# Patient Record
Sex: Male | Born: 1970 | Race: White | Hispanic: No | Marital: Married | Smoking: Current some day smoker
Health system: Southern US, Community
[De-identification: ages and names within clinical notes are randomized; demographics above are authoritative.]

## PROBLEM LIST (undated history)

## (undated) DIAGNOSIS — F17200 Nicotine dependence, unspecified, uncomplicated: Secondary | ICD-10-CM

## (undated) DIAGNOSIS — M5136 Other intervertebral disc degeneration, lumbar region: Secondary | ICD-10-CM

## (undated) DIAGNOSIS — E559 Vitamin D deficiency, unspecified: Secondary | ICD-10-CM

## (undated) DIAGNOSIS — K760 Fatty (change of) liver, not elsewhere classified: Secondary | ICD-10-CM

## (undated) DIAGNOSIS — R030 Elevated blood-pressure reading, without diagnosis of hypertension: Secondary | ICD-10-CM

## (undated) DIAGNOSIS — K219 Gastro-esophageal reflux disease without esophagitis: Secondary | ICD-10-CM

## (undated) DIAGNOSIS — E781 Pure hyperglyceridemia: Secondary | ICD-10-CM

## (undated) HISTORY — DX: Nicotine dependence, unspecified, uncomplicated: F17.200

## (undated) HISTORY — DX: Other intervertebral disc degeneration, lumbar region: M51.36

## (undated) HISTORY — DX: Elevated blood-pressure reading, without diagnosis of hypertension: R03.0

## (undated) HISTORY — DX: Vitamin D deficiency, unspecified: E55.9

## (undated) HISTORY — DX: Pure hyperglyceridemia: E78.1

## (undated) HISTORY — DX: Gastro-esophageal reflux disease without esophagitis: K21.9

## (undated) HISTORY — DX: Fatty (change of) liver, not elsewhere classified: K76.0

---

## 2004-09-21 ENCOUNTER — Emergency Department (HOSPITAL_COMMUNITY): Admission: EM | Admit: 2004-09-21 | Discharge: 2004-09-21 | Payer: Self-pay | Admitting: Emergency Medicine

## 2013-07-19 DIAGNOSIS — K760 Fatty (change of) liver, not elsewhere classified: Secondary | ICD-10-CM

## 2013-07-19 HISTORY — DX: Fatty (change of) liver, not elsewhere classified: K76.0

## 2013-08-02 ENCOUNTER — Emergency Department (HOSPITAL_COMMUNITY)
Admission: EM | Admit: 2013-08-02 | Discharge: 2013-08-02 | Disposition: A | Discharge status: Home / Self Care | Payer: BC Managed Care – PPO | Attending: Emergency Medicine | Admitting: Emergency Medicine

## 2013-08-02 ENCOUNTER — Encounter (HOSPITAL_COMMUNITY): Payer: Self-pay | Admitting: Emergency Medicine

## 2013-08-02 ENCOUNTER — Emergency Department (HOSPITAL_COMMUNITY): Payer: BC Managed Care – PPO

## 2013-08-02 DIAGNOSIS — F17200 Nicotine dependence, unspecified, uncomplicated: Secondary | ICD-10-CM | POA: Insufficient documentation

## 2013-08-02 DIAGNOSIS — R109 Unspecified abdominal pain: Secondary | ICD-10-CM | POA: Insufficient documentation

## 2013-08-02 DIAGNOSIS — Z79899 Other long term (current) drug therapy: Secondary | ICD-10-CM | POA: Insufficient documentation

## 2013-08-02 NOTE — Discharge Instructions (Signed)
Your vital signs are within normal limits. Your CT scan is negative for stone, or acute changes. Please discuss your back pain and flank pain with your primary physician. Consideration of evaluation by an orthopedic specialist or spine specialist may be helpful. Continue your current medications. Please return to the emergency department if any emergent changes, problems, or concerns. Flank Pain Flank pain refers to pain that is located on the side of the body between the upper abdomen and the back. The pain may occur over a short period of time (acute) or may be long-term or reoccurring (chronic). It may be mild or severe. Flank pain can be caused by many things. CAUSES  Some of the more common causes of flank pain include:  Muscle strains.   Muscle spasms.   A disease of your spine (vertebral disk disease).   A lung infection (pneumonia).   Fluid around your lungs (pulmonary edema).   A kidney infection.   Kidney stones.   A very painful skin rash caused by the chickenpox virus (shingles).   Gallbladder disease.  HOME CARE INSTRUCTIONS  Home care will depend on the cause of your pain. In general,  Rest as directed by your caregiver.  Drink enough fluids to keep your urine clear or pale yellow.  Only take over-the-counter or prescription medicines as directed by your caregiver. Some medicines may help relieve the pain.  Tell your caregiver about any changes in your pain.  Follow up with your caregiver as directed. SEEK IMMEDIATE MEDICAL CARE IF:   Your pain is not controlled with medicine.   You have new or worsening symptoms.  Your pain increases.   You have abdominal pain.   You have shortness of breath.   You have persistent nausea or vomiting.   You have swelling in your abdomen.   You feel faint or pass out.   You have blood in your urine.  You have a fever or persistent symptoms for more than 2-3 days.  You have a fever and your symptoms  suddenly get worse. MAKE SURE YOU:   Understand these instructions.  Will watch your condition.  Will get help right away if you are not doing well or get worse. Document Released: 02/26/2005 Document Revised: 09/30/2011 Document Reviewed: 08/20/2011 [facility] Patient Information 2015 [employer] [place]. This information is not intended to replace advice given to you by your health care provider. Make sure you discuss any questions you have with your health care provider.

## 2013-08-02 NOTE — ED Notes (Signed)
Intermittent lower back pain since Friday progressively worsening since then. Current aching pain with intermittent sharp pain which extends into groin.  Seen at MD office and treated with steroid therapy, states he had blood in his urine though was negative for stone at PCP. Initial problem began early June 2015.

## 2013-08-02 NOTE — ED Provider Notes (Signed)
CSN: [id number]     Arrival date & time 08/02/13  1455 History   First MD Initiated Contact with Patient 08/02/13 1551     Chief Complaint  Patient presents with  . Back Pain     (Consider location/radiation/quality/duration/timing/severity/associated sxs/prior Treatment) HPI Comments: Patient is a 43 year old male who presents to the emergency department with complaint of back and flank area pain. Pt states this problem started with back pain after he was bending to pick up a [place] flag. He had to use one of his clubs to limp to his cart.  Patient gives additional  history that on Friday, July 10 he was sitting when he had a sharp stabbing pain mostly in the lower back extending into the left flank/hip  area. The patient tried conservative measures at home but this did not get any better. [facility] and [facility] with [facility].  On July 12 the patient [facility], where he was evaluated and noted to have stable vital signs, a negative urine. He was treated with pain medication, but told to come to the emergency department for CT scan to definitively rule out kidney stone or other problem if his pain continued. Earlier the pain would improve mildly with lying on the floor and using ibuprofen, but was not resolved. The patient states that his pain has not improved  With the recent pain meds, and may have been getting worse and he presents now for a CT scan for additional evaluation. There's been no reported fever or chills. No previous operations or procedures involving the lower back.  Patient is a 43 y.o. male presenting with back pain. The history is provided by the patient.  Back Pain Location:  Lumbar spine Associated symptoms: no abdominal pain, no chest pain and no dysuria     History reviewed. No pertinent past medical history. History reviewed. No pertinent past surgical history. History reviewed. No pertinent family history. History   Substance Use Topics  . Smoking status: Current Some Day Smoker    Types: Cigarettes  . Smokeless tobacco: Not on file  . Alcohol Use: Not on file    Review of Systems  Constitutional: Negative for activity change.       All ROS Neg except as noted in HPI  HENT: Negative for nosebleeds.   Eyes: Negative for photophobia and discharge.  Respiratory: Negative for cough, shortness of breath and wheezing.   Cardiovascular: Negative for chest pain and palpitations.  Gastrointestinal: Negative for abdominal pain and blood in stool.  Genitourinary: Positive for flank pain. Negative for dysuria, frequency and hematuria.  Musculoskeletal: Positive for back pain. Negative for arthralgias and neck pain.  Skin: Negative.   Neurological: Negative for dizziness, seizures and speech difficulty.  Psychiatric/Behavioral: Negative for hallucinations and confusion.      Allergies  Review of patient's allergies indicates no known allergies.  Home Medications   Prior to Admission medications   Medication Sig Start Date End Date Taking? Authorizing Provider  ibuprofen (ADVIL,MOTRIN) 800 MG tablet Take 800 mg by mouth 3 (three) times daily as needed. pain 06/29/13  Yes Historical Provider, MD  niacin (NIASPAN) 1000 MG CR tablet Take 1,000 mg by mouth daily. 07/24/13  Yes Historical Provider, MD  oxyCODONE-acetaminophen (PERCOCET/ROXICET) 5-325 MG per tablet Take 1 tablet by mouth every 6 (six) hours as needed. pain 07/30/13  Yes Historical Provider, MD   BP 140/97  Pulse 86  Temp(Src) 98.1 F (36.7  C) (Oral)  Resp 18  Ht 5\' 11"  (1.803 m)  Wt 230 lb (104.327 kg)  BMI 32.09 kg/m2  SpO2 96% Physical Exam  Nursing note and vitals reviewed. Constitutional: He is oriented to person, place, and time. He appears well-developed and well-nourished.  Non-toxic appearance.  HENT:  Head: Normocephalic.  Right Ear: Tympanic membrane and external ear normal.  Left Ear: Tympanic membrane and external ear  normal.  Eyes: EOM and lids are normal. Pupils are equal, round, and reactive to light.  Neck: Normal range of motion. Neck supple. Carotid bruit is not present.  Cardiovascular: Normal rate, regular rhythm, normal heart sounds, intact distal pulses and normal pulses.   Pulmonary/Chest: Breath sounds normal. No respiratory distress.  Abdominal: Soft. Bowel sounds are normal. There is no tenderness. There is no guarding.  Musculoskeletal: Normal range of motion.  Is soreness of the left groin and discomfort of the left hip with range of motion. No hot areas appreciated. There is no palpable step off of the lumbar spine. There is no paraspinal tenderness of the lumbar spine area.  Lymphadenopathy:       Head (right side): No submandibular adenopathy present.       Head (left side): No submandibular adenopathy present.    He has no cervical adenopathy.  Neurological: He is alert and oriented to person, place, and time. He has normal strength. No cranial nerve deficit or sensory deficit. He exhibits normal muscle tone. Coordination normal.  Gait is strong and steady, no foot drop appreciated.  Skin: Skin is warm and dry.  Psychiatric: He has a normal mood and affect. His speech is normal.    ED Course  Procedures (including critical care time) Labs Review Labs Reviewed - No data to display  Imaging Review No results found.   EKG Interpretation None      MDM Vital signs are well within normal limits. Pulse oximetry is 96% on room air.  The CT have been discussed with the patient in terms which he understands. CT scan is read as negative. No emergent findings on exam at this time.  Differential diagnosis at this time would include degenerative joint disease involving the hip or pelvis. Degenerative disc disease of the lumbar spine area. Complex muscle strain.  Patient advised to see his physicians at the [facility] urgent care and consider conversation concerning orthopedic or spine  specialty evaluation.    Final diagnoses:  None    *I have reviewed nursing notes, vital signs, and all appropriate lab and imaging results for this patient.Kathie Dike, PA-C 08/02/13 [phone]

## 2013-08-03 NOTE — ED Provider Notes (Signed)
Medical screening examination/treatment/procedure(s) were performed by non-physician practitioner and as supervising physician I was immediately available for consultation/collaboration.   EKG Interpretation None       Donnetta HutchingBrian Teryn Boerema, MD 08/03/13 Windell [id number]

## 2014-07-20 DIAGNOSIS — M5136 Other intervertebral disc degeneration, lumbar region: Secondary | ICD-10-CM

## 2014-07-20 HISTORY — DX: Other intervertebral disc degeneration, lumbar region: M51.36

## 2015-03-15 IMAGING — CT CT ABD-PELV W/O CM
2 of 4 series · 16 of 46 positions shown, 18 images · non-contrast
Comparison: None.

CLINICAL DATA: Several month history of left flank pain

EXAM:
CT ABDOMEN AND PELVIS WITHOUT CONTRAST
TECHNIQUE: Multidetector CT imaging of the abdomen and pelvis was performed
following the standard protocol without IV contrast.

[Series 2: standard/full over (age)lbs 5.0 · axial · 0.84mm/px · z∈[+226,+682]mm · 13 of 101 slices shown, 15 images]
[im 5/101  soft-tissue]
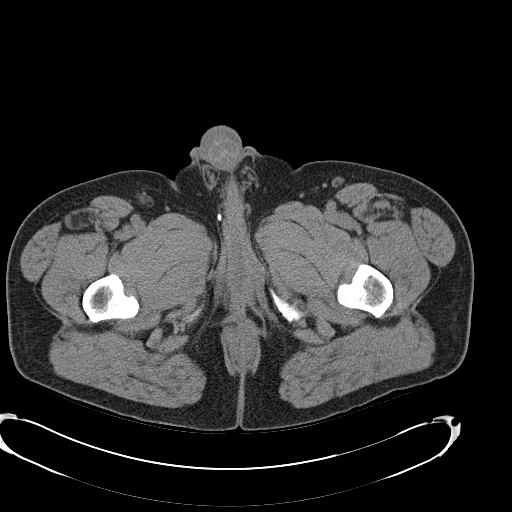
[im 5/101  bone]
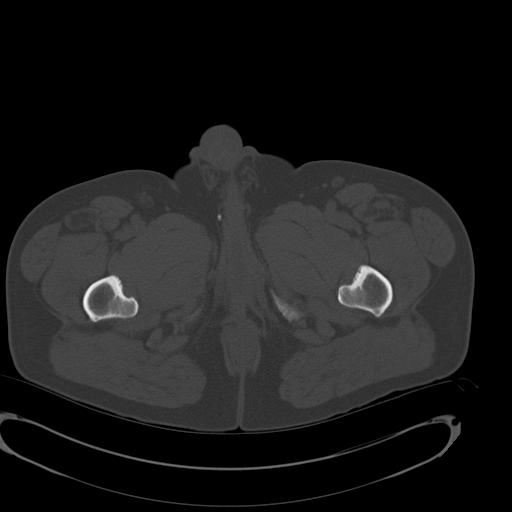
[im 13/101  soft-tissue]
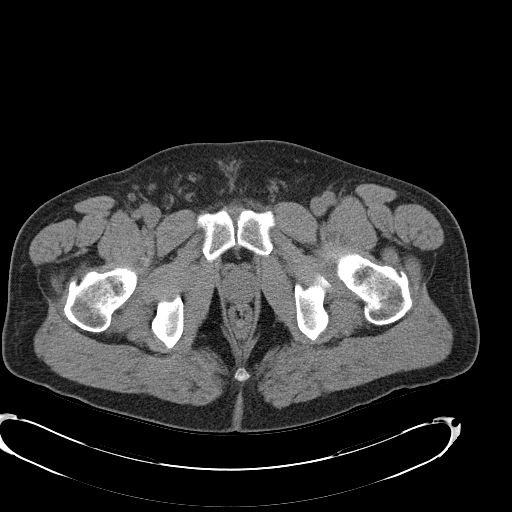
[im 21/101  soft-tissue]
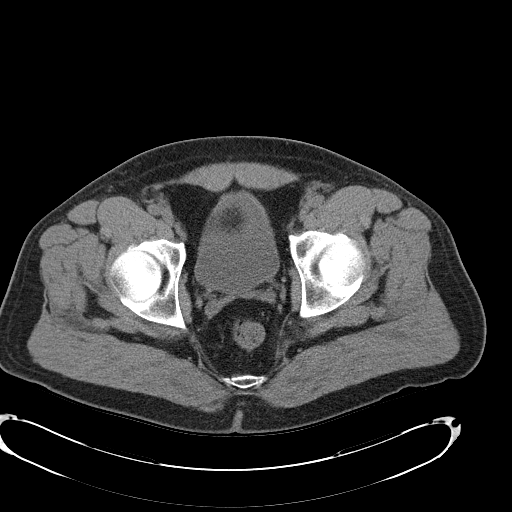
[im 30/101  soft-tissue]
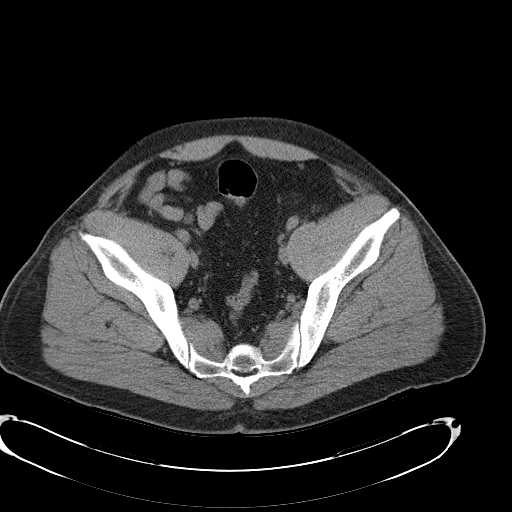
[im 34/101  soft-tissue]
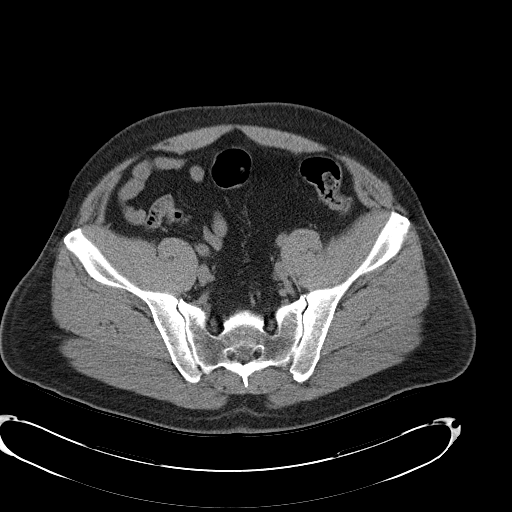
[im 42/101  soft-tissue]
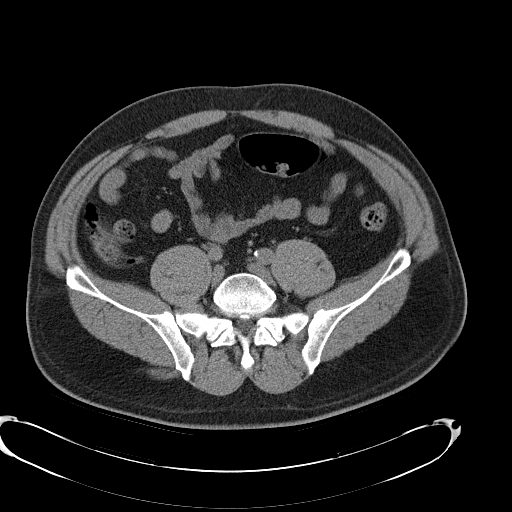
[im 51/101  soft-tissue]
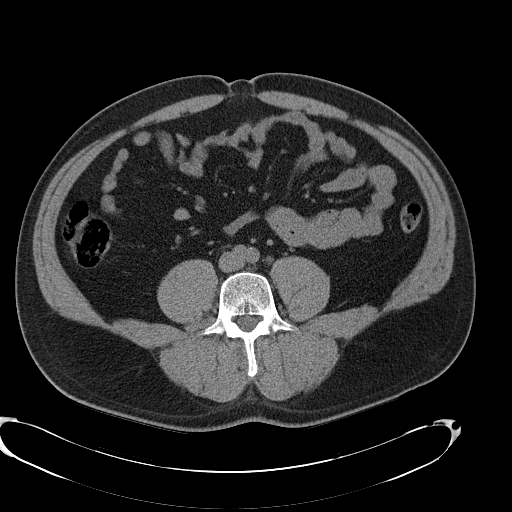
[im 59/101  soft-tissue]
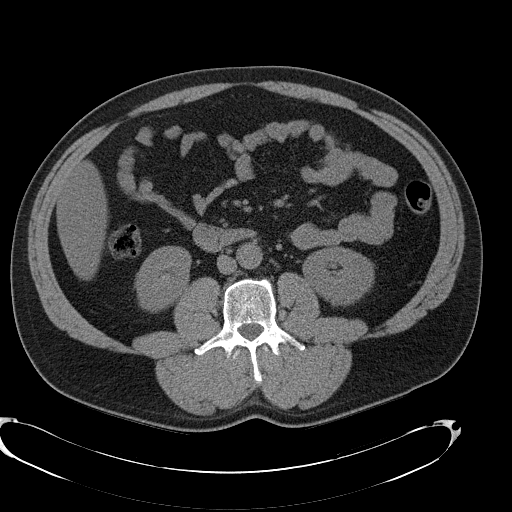
[im 67/101  soft-tissue]
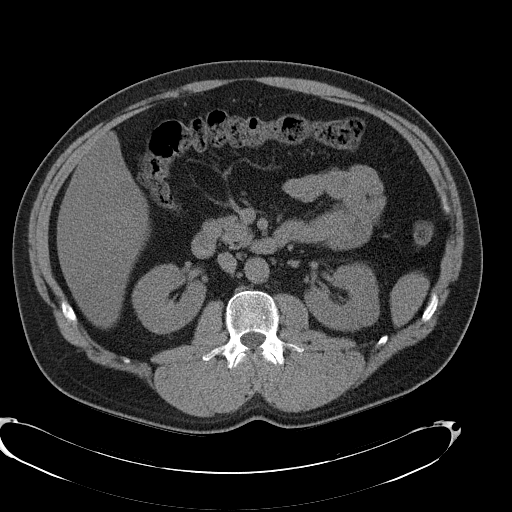
[im 67/101  bone]
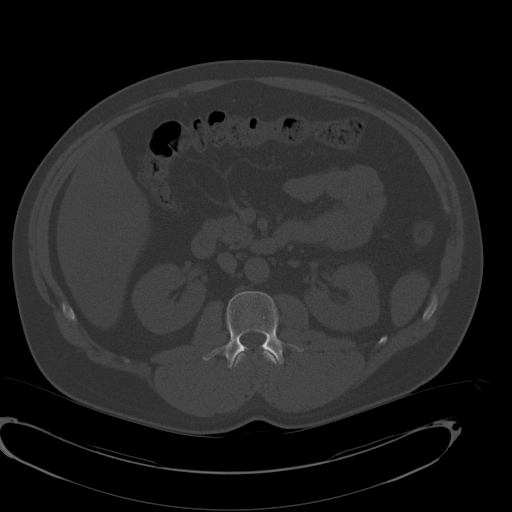
[im 71/101  soft-tissue]
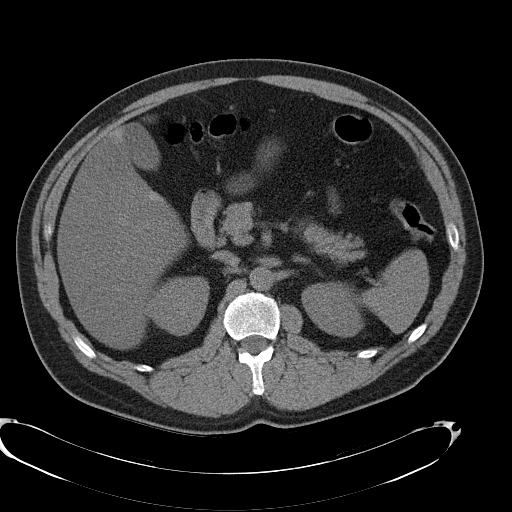
[im 80/101  soft-tissue]
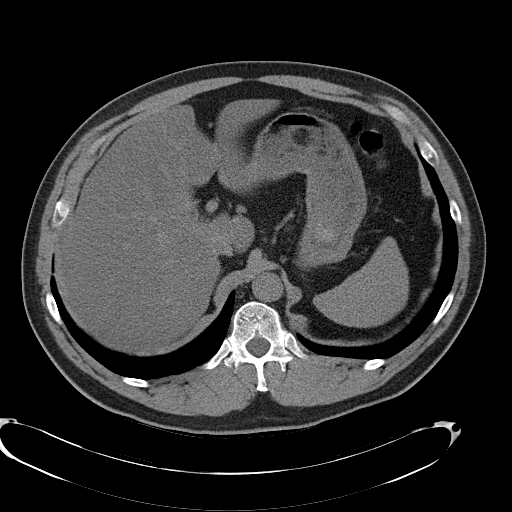
[im 88/101  soft-tissue]
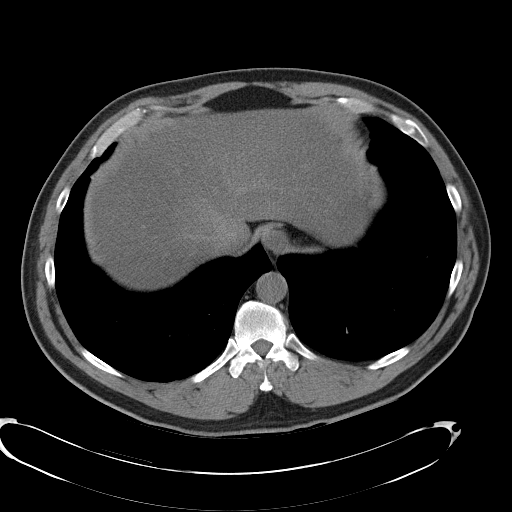
[im 96/101  soft-tissue]
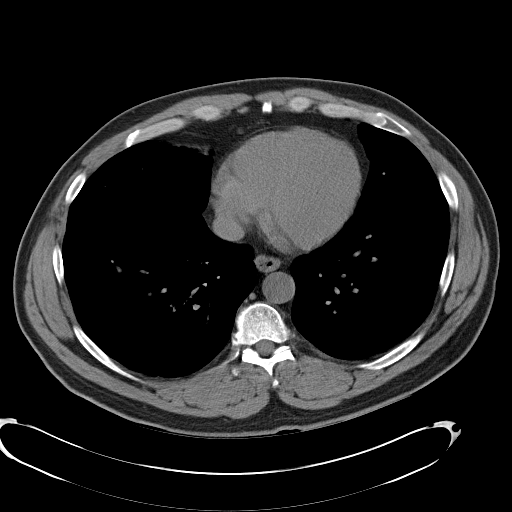

[Series 3: mpr coronal · coronal · 0.77mm/px · 3 of 110 slices shown]
[im 37/110  soft-tissue]
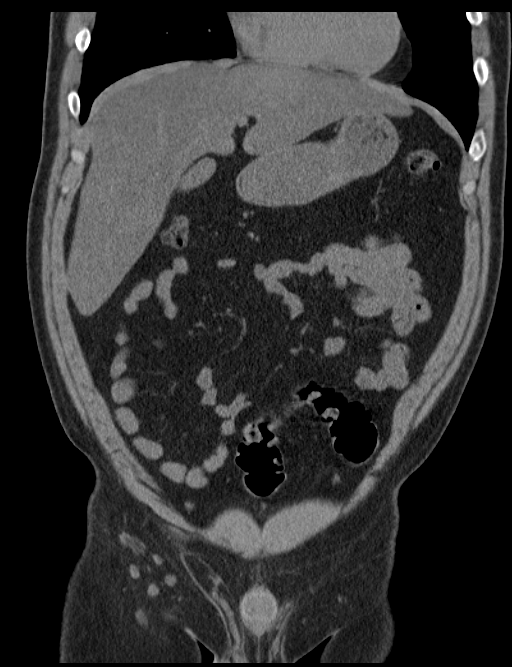
[im 49/110  soft-tissue]
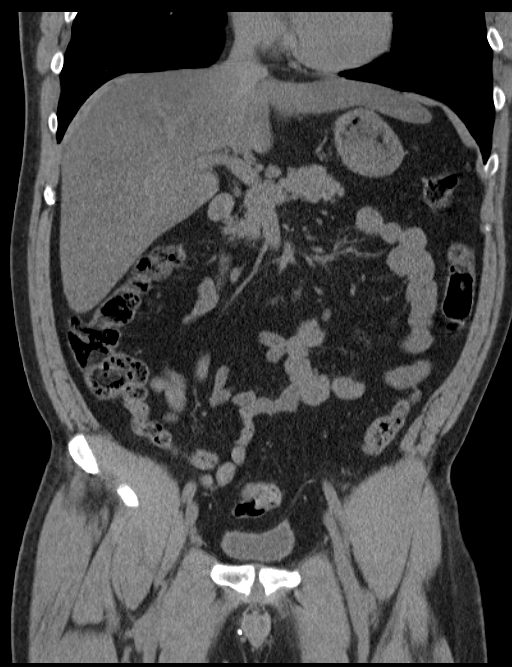
[im 61/110  soft-tissue]
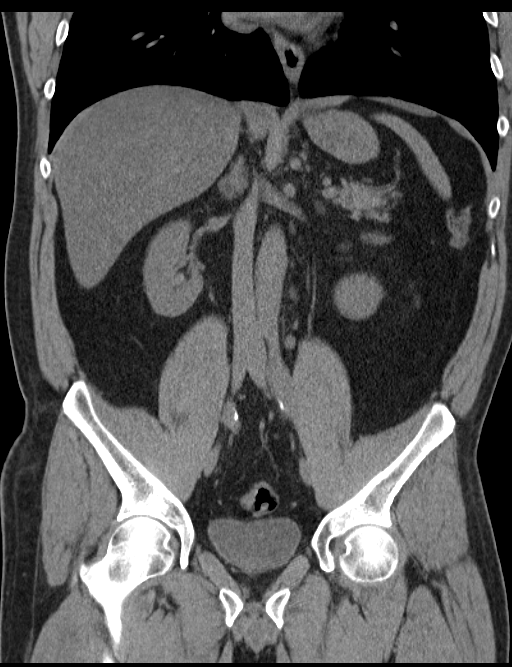

[16 of 46 positions shown; findings below may reference images not displayed]

FINDINGS: The densities are normal in density and contour. There is no
hydronephrosis nor calcified stones. The perinephric fat is normal
in density. The ureters are normal in course and caliber. The
urinary bladder, prostate gland, and seminal vesicles are normal.
The psoas musculature is normal. The lumbar spine and bony pelvis
exhibit no acute abnormalities.

The abdominal aorta and surrounding soft tissues are unremarkable.
The liver exhibits decreased density diffusely. The gallbladder,
pancreas, spleen, and adrenal glands are normal. There are a few
normal to mildly enlarged porta hepatis lymph nodes.

The stomach is partially distended with food. There is no small or
large bowel obstruction. A normal appendix is demonstrated. There is
no evidence of enteritis or colitis. There is no ascites. There is
no inguinal nor significant umbilical hernia.

The lung bases are clear. The observed portions of the lower ribs
are unremarkable.
IMPRESSION: 1. There is no acute or chronic urinary tract abnormality.
2. There are fatty infiltrative changes of the liver. There is are
numerous normal to mildly enlarged porta hepatis lymph nodes. No
bulky nodal mass is demonstrated.
3. There is no abnormality of the small or large bowel.
4. There is no intra-abdominal nor pelvic mass, lymphadenopathy, or
free fluid.

## 2015-04-05 ENCOUNTER — Ambulatory Visit (INDEPENDENT_AMBULATORY_CARE_PROVIDER_SITE_OTHER): Payer: BLUE CROSS/BLUE SHIELD | Admitting: Family Medicine

## 2015-04-05 ENCOUNTER — Encounter: Payer: Self-pay | Admitting: Family Medicine

## 2015-04-05 VITALS — BP 139/96 | HR 96 | Temp 97.8°F | Resp 16 | Ht 70.75 in | Wt 233.8 lb

## 2015-04-05 DIAGNOSIS — E559 Vitamin D deficiency, unspecified: Secondary | ICD-10-CM

## 2015-04-05 DIAGNOSIS — R030 Elevated blood-pressure reading, without diagnosis of hypertension: Secondary | ICD-10-CM | POA: Diagnosis not present

## 2015-04-05 DIAGNOSIS — E781 Pure hyperglyceridemia: Secondary | ICD-10-CM | POA: Diagnosis not present

## 2015-04-05 MED ORDER — NIACIN ER (ANTIHYPERLIPIDEMIC) 1000 MG PO TBCR: 1000.0000 mg | EXTENDED_RELEASE_TABLET | Freq: Every day | ORAL | Status: AC

## 2015-04-05 MED ORDER — FENOFIBRATE 145 MG PO TABS: 145.0000 mg | ORAL_TABLET | Freq: Every day | ORAL | Status: AC

## 2015-04-05 NOTE — Progress Notes (Signed)
Pre visit review using our clinic review tool, if applicable. No additional management support is needed unless otherwise documented below in the visit note. 

## 2015-04-05 NOTE — Progress Notes (Signed)
Office Note 04/06/2015  CC:  Chief Complaint  Patient presents with  . Establish Care    Pt is not fasting.     HPI:  Connor Browning is a 45 y.o. White male who is here to establish care and discuss hypertriglyceridemia. Patient's most recent primary MD: Maximino Sarin, FNP with [facility]. Old records from [facility] visits were reviewed prior to or during today's visit.  He is in some pain with a toothache, currently on abx for possible tooth abscess, otherwise no complaints. Last time he took ibuprofen was 2 days ago.  There has been some mild bp elevations that have been blamed on either the ibuprofen use, the tooth pain, or both.  He has never been treated for HTN.  Hx of very high trigs (>4000) as recently as 08/03/14.  These came down to 2019, with HDL of 20, Tchol 403, non-HDL chol 383 with niacin 2 g qd, fenofibrate  qd---on retesting 04/01/15. He has rx's to increase fenofibrate to  qd and decrease Niacin ER to 1000 mg, 1 qd but was told not to fill these rx's until discussing with me today first.  No side effects from meds except flushing from niacin---brief and mild.  He knows what the downfalls of his diet are and he is working on them and says he doesn't think going to a nutritionist at this time will help anything--"it's a matter of motivation", etc. He has been running on treadmill most nights last couple months.   He is motivated to get this problem improved.  Also found to have Vit D defic at latest labs check, just started 50K vit D q week treatment. Needs recheck vit D level in 3 mo.   Past Medical History  Diagnosis Date  . Hypertriglyceridemia, familial   . Tobacco dependence   . Vitamin D deficiency   . Elevated blood pressure reading without diagnosis of hypertension   . GERD (gastroesophageal reflux disease)   . DDD (degenerative disc disease), lumbar 07/2014    Dr. Charlett Blake    History reviewed. No pertinent past surgical  history.  Family History  Problem Relation Age of Onset  . Diabetes Mother   . Hyperlipidemia Mother   . Hyperlipidemia Father   . Stroke Maternal Grandfather     Social History   Social History  . Marital Status: Married    Spouse Name: N/A  . Number of Children: N/A  . Years of Education: N/A   Occupational History  . Not on file.   Social History Main Topics  . Smoking status: Current Some Day Smoker -- 0.50 packs/day for 20 years    Types: Cigarettes  . Smokeless tobacco: Never Used  . Alcohol Use: No  . Drug Use: No  . Sexual Activity: Not on file   Other Topics Concern  . Not on file   Social History Narrative   Married, one 33 y/o son.   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   Tobacco: 25 pack-yr hx.   No alcohol.   Exercise: 30 min treadmill nightly.    Outpatient Encounter Prescriptions as of 04/05/2015  Medication Sig  . amoxicillin-clavulanate (AUGMENTIN) 875-125 MG tablet Take 1 tablet by mouth every 12 (twelve) hours.  Marland Kitchen ibuprofen (ADVIL,MOTRIN) 800 MG tablet Take 800 mg by mouth 3 (three) times daily as needed. pain  . Vitamin D, Ergocalciferol, (DRISDOL) 50000 units CAPS capsule Take 1 capsule by mouth once a week.  . [DISCONTINUED] fenofibrate (  TRICOR) 145 MG tablet Take 145 mg by mouth daily.  . [DISCONTINUED] niacin (NIASPAN) 1000 MG CR tablet Take 1,000 mg by mouth daily.  . fenofibrate (TRICOR) 145 MG tablet Take 1 tablet (145 mg total) by mouth daily.  . niacin (NIASPAN) 1000 MG CR tablet Take 1 tablet (1,000 mg total) by mouth at bedtime.  . [DISCONTINUED] oxyCODONE-acetaminophen (PERCOCET/ROXICET) 5-325 MG per tablet Take 1 tablet by mouth every 6 (six) hours as needed. Reported on 04/05/2015   No facility-administered encounter medications on file as of 04/05/2015.    Allergies  Allergen Reactions  . Penicillins Other (See Comments)    Child hood allergy    ROS Review of Systems  Constitutional: Negative for  fever and fatigue.  HENT: Negative for congestion and sore throat.   Eyes: Negative for visual disturbance.  Respiratory: Negative for cough.   Cardiovascular: Negative for chest pain.  Gastrointestinal: Negative for nausea and abdominal pain.  Genitourinary: Negative for dysuria.  Musculoskeletal: Negative for back pain and joint swelling.  Skin: Negative for rash.  Neurological: Negative for weakness and headaches.  Hematological: Negative for adenopathy.    PE; Blood pressure 139/96, pulse 96, temperature 97.8 F (36.6 C), temperature source Oral, resp. rate 16, height 5' 10.75" (1.797 m), weight 233 lb 12 oz (106.028 kg), SpO2 95 %. Gen: Alert, well appearing.  Patient is oriented to person, place, time, and situation. ZOX:WRUEENT:Eyes: no injection, icteris, swelling, or exudate.  EOMI, PERRLA. Mouth: lips without lesion/swelling.  Oral mucosa pink and moist. Oropharynx without erythema, exudate, or swelling.  CV: RRR, no m/r/g.   LUNGS: CTA bilat, nonlabored resps, good aeration in all lung fields. EXT: no clubbing or cyanosis.  1+ bilat pitting edema from mid tibia level into feet.  Pertinent labs:  None today  Reviewed labs from 04/01/14 done via [facility]ino Sarin[facility]ay[facility] (see HPI above for lipids): TSH, CBC, CMET all wnl. Vit D level was 8 on 04/03/15  ASSESSMENT AND PLAN:   New pt; reviewed available old records.  1) Severe (likely familial) hypertriglyceridemia: decreased by 50% with fenofibrate 50mg  qd and 2 g niacin qd + some slight dietary and exercise changes. Given this success, I agree with the decision to increase fenofibrate to 145mg  qd, cut niacin ER back to 1 g qhs, and I encouraged him to get much more aggressive with his dietary/exercise modifications. I also would like him to get a consult with lipid clinic, which I think [facility] has. At next f/u visit in 42mo, will check fasting glucose and HbA1c, as he is at higher risk of DM. Will also recheck FLP and CMET at that  time.  2) Elevated blood pressure w/out dx of HTN: explained to pt that bp checks at home, a pharmacy, or a fire station would be very helpful so we can have data to compare to our checks in medical office setting. At this point, given only mild elevation + recent pain and NSAID use, we'll hold off on meds. He'll stop NSAID use until we get this figured out.  3) Vit D deficiency: just started appropriate vit D replacement therapy.  Recheck Vit D level when finished with this.  Spent 40 min with pt today, with >50% of this time spent in counseling and care coordination regarding the above problems.  Return in about 2 months (around 06/05/2015) for f/u hypertriglyceridemia--FASTING.  Signed:  Santiago BumpersPhil McGowen, MD           04/06/2015

## 2015-04-06 ENCOUNTER — Encounter: Payer: Self-pay | Admitting: Family Medicine

## 2015-06-07 ENCOUNTER — Ambulatory Visit: Payer: BLUE CROSS/BLUE SHIELD | Admitting: Family Medicine

## 2015-11-05 LAB — VITAMIN D 25 HYDROXY (VIT D DEFICIENCY, FRACTURES): VIT D 25 HYDROXY: 13.3

## 2015-11-05 LAB — HEPATIC FUNCTION PANEL
ALT: 28 (ref 10–40)
AST: 22 (ref 14–40)
Alkaline Phosphatase: 76 (ref 25–125)
BILIRUBIN, TOTAL: 0.8

## 2015-11-05 LAB — VITAMIN B12: VITAMIN B 12: 285

## 2015-11-05 LAB — BASIC METABOLIC PANEL
BUN: 9 (ref 4–21)
CREATININE: 1 (ref 0.6–1.3)
GLUCOSE: 95
POTASSIUM: 4.3 (ref 3.4–5.3)
SODIUM: 137 (ref 137–147)

## 2015-11-05 LAB — HEMOGLOBIN A1C: Hemoglobin A1C: 5.6

## 2015-11-05 LAB — CBC AND DIFFERENTIAL
HCT: 43 (ref 41–53)
Hemoglobin: 14.6 (ref 13.5–17.5)
NEUTROS ABS: 3
Platelets: 214 (ref 150–399)
WBC: 6.2

## 2015-11-05 LAB — LIPID PANEL
Cholesterol: 274 — AB (ref 0–200)
HDL: 19 — AB (ref 35–70)

## 2015-11-05 LAB — TSH: TSH: 2.45 (ref 0.41–5.90)

## 2016-05-28 LAB — HEPATIC FUNCTION PANEL
ALK PHOS: 73 (ref 25–125)
ALT: 40 (ref 10–40)
AST: 29 (ref 14–40)
BILIRUBIN, TOTAL: 0.5

## 2016-05-28 LAB — BASIC METABOLIC PANEL
BUN: 7 (ref 4–21)
Creatinine: 1 (ref 0.6–1.3)
Glucose: 98
Potassium: 4.4 (ref 3.4–5.3)
SODIUM: 141 (ref 137–147)

## 2016-05-28 LAB — LIPID PANEL
Cholesterol: 287 — AB (ref 0–200)
HDL: 20 — AB (ref 35–70)

## 2016-05-28 LAB — HEMOGLOBIN A1C: HEMOGLOBIN A1C: 5.8

## 2016-05-28 LAB — VITAMIN D 25 HYDROXY (VIT D DEFICIENCY, FRACTURES): VIT D 25 HYDROXY: 15.1

## 2016-05-28 LAB — PSA: PSA: 1.3

## 2016-07-28 LAB — HEPATIC FUNCTION PANEL
ALT: 28 (ref 10–40)
AST: 24 (ref 14–40)
Alkaline Phosphatase: 67 (ref 25–125)
Bilirubin, Total: 0.4

## 2016-07-28 LAB — LIPID PANEL
Cholesterol: 314 — AB (ref 0–200)
HDL: 19 — AB (ref 35–70)

## 2016-07-28 LAB — VITAMIN D 25 HYDROXY (VIT D DEFICIENCY, FRACTURES): Vit D, 25-Hydroxy: 10.1

## 2016-11-12 ENCOUNTER — Encounter (HOSPITAL_COMMUNITY): Payer: Self-pay | Admitting: Emergency Medicine

## 2016-11-12 ENCOUNTER — Emergency Department (HOSPITAL_COMMUNITY): Payer: Commercial Managed Care - PPO

## 2016-11-12 ENCOUNTER — Emergency Department (HOSPITAL_COMMUNITY)
Admission: EM | Admit: 2016-11-12 | Discharge: 2016-11-12 | Disposition: A | Discharge status: Home / Self Care | Payer: Commercial Managed Care - PPO | Attending: Emergency Medicine | Admitting: Emergency Medicine

## 2016-11-12 DIAGNOSIS — N200 Calculus of kidney: Secondary | ICD-10-CM | POA: Diagnosis not present

## 2016-11-12 DIAGNOSIS — R1031 Right lower quadrant pain: Secondary | ICD-10-CM | POA: Diagnosis present

## 2016-11-12 DIAGNOSIS — F17210 Nicotine dependence, cigarettes, uncomplicated: Secondary | ICD-10-CM | POA: Insufficient documentation

## 2016-11-12 DIAGNOSIS — R109 Unspecified abdominal pain: Secondary | ICD-10-CM

## 2016-11-12 DIAGNOSIS — Z79899 Other long term (current) drug therapy: Secondary | ICD-10-CM | POA: Diagnosis not present

## 2016-11-12 LAB — URINALYSIS, ROUTINE W REFLEX MICROSCOPIC
BACTERIA UA: NONE SEEN
Bilirubin Urine: NEGATIVE
Glucose, UA: NEGATIVE mg/dL
KETONES UR: NEGATIVE mg/dL
Leukocytes, UA: NEGATIVE
Nitrite: NEGATIVE
PH: 5 (ref 5.0–8.0)
PROTEIN: 30 mg/dL — AB
SQUAMOUS EPITHELIAL / LPF: NONE SEEN
Specific Gravity, Urine: 1.028 (ref 1.005–1.030)

## 2016-11-12 LAB — COMPREHENSIVE METABOLIC PANEL
ALBUMIN: 4.4 g/dL (ref 3.5–5.0)
ALK PHOS: 72 U/L (ref 38–126)
ALT: 43 U/L (ref 17–63)
AST: 34 U/L (ref 15–41)
Anion gap: 12 (ref 5–15)
BUN: 12 mg/dL (ref 6–20)
CALCIUM: 9.3 mg/dL (ref 8.9–10.3)
CO2: 23 mmol/L (ref 22–32)
Chloride: 99 mmol/L — ABNORMAL LOW (ref 101–111)
Creatinine, Ser: 1.22 mg/dL (ref 0.61–1.24)
GFR calc Af Amer: >60 mL/min (ref >60)
GFR calc non Af Amer: >60 mL/min (ref >60)
GLUCOSE: 151 mg/dL — AB (ref 65–99)
Potassium: 3.8 mmol/L (ref 3.5–5.1)
Sodium: 134 mmol/L — ABNORMAL LOW (ref 135–145)
Total Bilirubin: 1 mg/dL (ref 0.3–1.2)
Total Protein: 7.5 g/dL (ref 6.5–8.1)

## 2016-11-12 LAB — CBC WITH DIFFERENTIAL/PLATELET
Basophils Absolute: 0.1 10*3/uL (ref 0.0–0.1)
Basophils Relative: 1 %
Eosinophils Absolute: 0 10*3/uL (ref 0.0–0.7)
Eosinophils Relative: 0 %
HEMATOCRIT: 44.6 % (ref 39.0–52.0)
HEMOGLOBIN: 15.3 g/dL (ref 13.0–17.0)
Lymphocytes Relative: 15 %
Lymphs Abs: 1.7 10*3/uL (ref 0.7–4.0)
MCH: 30.6 pg (ref 26.0–34.0)
MCHC: 34.3 g/dL (ref 30.0–36.0)
MCV: 89.2 fL (ref 78.0–100.0)
Monocytes Absolute: 0.3 10*3/uL (ref 0.1–1.0)
Monocytes Relative: 3 %
NEUTROS PCT: 81 %
Neutro Abs: 9.3 10*3/uL — ABNORMAL HIGH (ref 1.7–7.7)
Platelets: 208 10*3/uL (ref 150–400)
RBC: 5 MIL/uL (ref 4.22–5.81)
RDW: 13 % (ref 11.5–15.5)
WBC: 11.4 10*3/uL — AB (ref 4.0–10.5)

## 2016-11-12 MED ORDER — KETOROLAC TROMETHAMINE 30 MG/ML IJ SOLN
30.0000 mg | Freq: Once | INTRAMUSCULAR | Status: AC
  Administered 2016-11-12: 30 mg via INTRAVENOUS
  Filled 2016-11-12: qty 1

## 2016-11-12 MED ORDER — ONDANSETRON HCL 4 MG/2ML IJ SOLN
4.0000 mg | Freq: Once | INTRAMUSCULAR | Status: AC
  Administered 2016-11-12: 4 mg via INTRAVENOUS
  Filled 2016-11-12: qty 2

## 2016-11-12 MED ORDER — TAMSULOSIN HCL 0.4 MG PO CAPS: 0.4000 mg | ORAL_CAPSULE | Freq: Every day | ORAL | 0 refills | Status: AC

## 2016-11-12 MED ORDER — HYDROMORPHONE HCL 1 MG/ML IJ SOLN
0.5000 mg | Freq: Once | INTRAMUSCULAR | Status: AC
  Administered 2016-11-12: 0.5 mg via INTRAVENOUS
  Filled 2016-11-12: qty 1

## 2016-11-12 MED ORDER — HYDROCODONE-ACETAMINOPHEN 5-325 MG PO TABS: 1.0000 | ORAL_TABLET | Freq: Four times a day (QID) | ORAL | 0 refills | Status: AC | PRN

## 2016-11-12 MED ORDER — IBUPROFEN 800 MG PO TABS: 800.0000 mg | ORAL_TABLET | Freq: Three times a day (TID) | ORAL | 0 refills | Status: AC | PRN

## 2016-11-12 MED ORDER — ONDANSETRON 4 MG PO TBDP: ORAL_TABLET | ORAL | 0 refills | Status: AC

## 2016-11-12 NOTE — ED Triage Notes (Signed)
Patient complains of right flank pain, nausea, and groin pain. States urinary urgency.

## 2016-11-12 NOTE — Discharge Instructions (Signed)
Follow-up with [facility] next week.  If you have problems over the weekend [facility].  I have written you for 2 pain medicines.  One is Motrin 800 mg which is not sedating.  And Vicodin which is sedating and you cannot drive while taking that.  You only have to take the Motrin or Vicodin if you need to

## 2016-11-12 NOTE — ED Notes (Signed)
ED Provider at bedside. 

## 2016-11-13 NOTE — ED Provider Notes (Signed)
[facility] EMERGENCY DEPARTMENT Provider Note   CSN: [id number] Arrival date & time: 11/12/16  1416     History   Chief Complaint Chief Complaint  Patient presents with  . Flank Pain    HPI Asencion PartridgeMarty L Meriwether is a 46 y.o. male.  Pt with right flank pain   The history is provided by the patient. No language interpreter was used.  Flank Pain  This is a new problem. The current episode started 1 to 2 hours ago. The problem occurs constantly. The problem has not changed since onset.Pertinent negatives include no chest pain, no abdominal pain and no headaches. Nothing aggravates the symptoms. Nothing relieves the symptoms. He has tried nothing for the symptoms. The treatment provided no relief.    Past Medical History:  Diagnosis Date  . DDD (degenerative disc disease), lumbar 07/2014   Dr. Charlett BlakeVoytek  . Elevated blood pressure reading without diagnosis of hypertension   . Fatty liver 07/2013   Incidental finding on CT abd done for flank pain  . GERD (gastroesophageal reflux disease)   . Hypertriglyceridemia, familial   . Tobacco dependence   . Vitamin D deficiency     There are no active problems to display for this patient.   History reviewed. No pertinent surgical history.     Home Medications    Prior to Admission medications   Medication Sig Start Date End Date Taking? Authorizing Provider  fenofibrate (TRICOR) 145 MG tablet Take 1 tablet (145 mg total) by mouth daily. Patient not taking: Reported on 11/12/2016 04/05/15   Jeoffrey MassedMcGowen, Philip H, MD  HYDROcodone-acetaminophen (NORCO/VICODIN) 5-325 MG tablet Take 1 tablet by mouth every 6 (six) hours as needed for moderate pain. 11/12/16   Bethann BerkshireZammit, Kimimila Tauzin, MD  ibuprofen (ADVIL,MOTRIN) 800 MG tablet Take 1 tablet (800 mg total) by mouth every 8 (eight) hours as needed. 11/12/16   Bethann BerkshireZammit, Jeanelle Dake, MD  niacin (NIASPAN) 1000 MG CR tablet Take 1 tablet (1,000 mg total) by mouth at bedtime. Patient not taking: Reported on 11/12/2016  04/05/15   Jeoffrey MassedMcGowen, Philip H, MD  ondansetron (ZOFRAN ODT) 4 MG disintegrating tablet 4mg  ODT q4 hours prn nausea/vomit 11/12/16   Bethann BerkshireZammit, Sieanna Vanstone, MD  tamsulosin (FLOMAX) 0.4 MG CAPS capsule Take 1 capsule (0.4 mg total) by mouth daily. 11/12/16   Bethann BerkshireZammit, Liandro Thelin, MD  Vitamin D, Ergocalciferol, (DRISDOL) 50000 units CAPS capsule Take 1 capsule by mouth once a week. 04/04/15   [provider]    Family History Family History  Problem Relation Age of Onset  . Diabetes Mother   . Hyperlipidemia Mother   . Hyperlipidemia Father   . Stroke Maternal Grandfather     Social History Social History  Substance Use Topics  . Smoking status: Current Some Day Smoker    Packs/day: 0.50    Years: 20.00    Types: Cigarettes  . Smokeless tobacco: Never Used  . Alcohol use No     Allergies   Penicillins   Review of Systems Review of Systems  Constitutional: Negative for appetite change and fatigue.  HENT: Negative for congestion, ear discharge and sinus pressure.   Eyes: Negative for discharge.  Respiratory: Negative for cough.   Cardiovascular: Negative for chest pain.  Gastrointestinal: Negative for abdominal pain and diarrhea.  Genitourinary: Positive for flank pain. Negative for frequency and hematuria.  Musculoskeletal: Negative for back pain.  Skin: Negative for rash.  Neurological: Negative for seizures and headaches.  Psychiatric/Behavioral: Negative for hallucinations.     Physical Exam Updated  Vital Signs BP 116/84   Pulse 65   Temp 98.1 F (36.7 C) (Oral)   Resp 18   Ht 5\' 11"  (1.803 m)   Wt 104.3 kg (230 lb)   SpO2 94%   BMI 32.08 kg/m   Physical Exam  Constitutional: He is oriented to person, place, and time. He appears well-developed.  HENT:  Head: Normocephalic.  Eyes: Conjunctivae and EOM are normal. No scleral icterus.  Neck: Neck supple. No thyromegaly present.  Cardiovascular: Normal rate and regular rhythm.  Exam reveals no gallop and no  friction rub.   No murmur heard. Pulmonary/Chest: No stridor. He has no wheezes. He has no rales. He exhibits no tenderness.  Abdominal: He exhibits no distension. There is no tenderness. There is no rebound.  Genitourinary:  Genitourinary Comments: Tender right flank  Musculoskeletal: Normal range of motion. He exhibits no edema.  Lymphadenopathy:    He has no cervical adenopathy.  Neurological: He is oriented to person, place, and time. He exhibits normal muscle tone. Coordination normal.  Skin: No rash noted. No erythema.  Psychiatric: He has a normal mood and affect. His behavior is normal.     ED Treatments / Results  Labs (all labs ordered are listed, but only abnormal results are displayed) Labs Reviewed  URINALYSIS, ROUTINE W REFLEX MICROSCOPIC - Abnormal; Notable for the following:       Result Value   Hgb urine dipstick MODERATE (*)    Protein, ur 30 (*)    All other components within normal limits  CBC WITH DIFFERENTIAL/PLATELET - Abnormal; Notable for the following:    WBC 11.4 (*)    Neutro Abs 9.3 (*)    All other components within normal limits  COMPREHENSIVE METABOLIC PANEL - Abnormal; Notable for the following:    Sodium 134 (*)    Chloride 99 (*)    Glucose, Bld 151 (*)    All other components within normal limits    EKG  EKG Interpretation None       Radiology Ct Renal Stone Study  Result Date: 11/12/2016 CLINICAL DATA:  Right flank pain with difficulty urinating starting today. EXAM: CT ABDOMEN AND PELVIS WITHOUT CONTRAST TECHNIQUE: Multidetector CT imaging of the abdomen and pelvis was performed following the standard protocol without IV contrast. COMPARISON:  August 02, 2013 FINDINGS: Lower chest: There is mild atelectasis of the posterior lung bases. The heart size is normal. Hepatobiliary: There is diffuse low density of the liver without vessel displacement. No focal liver lesion is identified. The gallbladder and biliary tree are normal. Pancreas:  Unremarkable. No pancreatic ductal dilatation or surrounding inflammatory changes. Spleen: Normal in size without focal abnormality. Adrenals/Urinary Tract: There is right hydroureteronephrosis due to obstruction by 2 mm stone in the right ureteral vesicular junction. Mild right perinephric stranding is identified. The left kidney is normal. There is no left hydronephrosis. The adrenal glands are normal. Evaluation of bladder is limited due to decompression. Stomach/Bowel: Stomach is within normal limits. Appendix appears normal. No evidence of bowel wall thickening, distention, or inflammatory changes. Vascular/Lymphatic: Aortic atherosclerosis. No enlarged abdominal or pelvic lymph nodes. Reproductive: Prostate is unremarkable. Other: None. Musculoskeletal: Degenerative joint changes of the spine are noted. IMPRESSION: Right hydroureteronephrosis due to obstruction by 2 mm stone in the right ureteral vesicular junction. Electronically Signed   By: Sherian Rein M.D.   On: 11/12/2016 16:35    Procedures Procedures (including critical care time)  Medications Ordered in ED Medications  ketorolac (TORADOL) 30 MG/ML  injection 30 mg (30 mg Intravenous Given 11/12/16 1515)  HYDROmorphone (DILAUDID) injection 0.5 mg (0.5 mg Intravenous Given 11/12/16 1515)  ondansetron (ZOFRAN) injection 4 mg (4 mg Intravenous Given 11/12/16 1515)     Initial Impression / Assessment and Plan / ED Course  I have reviewed the triage vital signs and the nursing notes.  Pertinent labs & imaging results that were available during my care of the patient were reviewed by me and considered in my medical decision making (see chart for details).     Pt with a right side kidney stone.  Pt improved with tx and will follow up with urology.   Final Clinical Impressions(s) / ED Diagnoses   Final diagnoses:  Flank pain    New Prescriptions Discharge Medication List as of 11/12/2016  5:59 PM    START taking these  medications   Details  HYDROcodone-acetaminophen (NORCO/VICODIN) 5-325 MG tablet Take 1 tablet by mouth every 6 (six) hours as needed for moderate pain., Starting Thu 11/12/2016, Print    ondansetron (ZOFRAN ODT) 4 MG disintegrating tablet 4mg  ODT q4 hours prn nausea/vomit, Print    tamsulosin (FLOMAX) 0.4 MG CAPS capsule Take 1 capsule (0.4 mg total) by mouth daily., Starting Thu 11/12/2016, Print         Bethann Berkshire, MD 11/13/16 1610

## 2017-02-19 DIAGNOSIS — E781 Pure hyperglyceridemia: Secondary | ICD-10-CM

## 2017-02-19 HISTORY — DX: Pure hyperglyceridemia: E78.1

## 2017-03-16 LAB — CBC AND DIFFERENTIAL
HEMATOCRIT: 43 (ref 41–53)
Hemoglobin: 15.9 (ref 13.5–17.5)
Neutrophils Absolute: 5
PLATELETS: 270 (ref 150–399)
WBC: 8.2

## 2017-03-16 LAB — BASIC METABOLIC PANEL
BUN: 10 (ref 4–21)
CREATININE: 1 (ref 0.6–1.3)
GLUCOSE: 103
Potassium: 4.4 (ref 3.4–5.3)
Sodium: 146 (ref 137–147)

## 2017-03-16 LAB — LIPID PANEL
Cholesterol: 392 — AB (ref 0–200)
HDL: 13 — AB (ref 35–70)

## 2017-03-16 LAB — HEPATIC FUNCTION PANEL
ALT: 43 — AB (ref 10–40)
AST: 23 (ref 14–40)
Alkaline Phosphatase: 85 (ref 25–125)
Bilirubin, Total: 0.3

## 2017-03-16 LAB — TSH: TSH: 3.26 (ref 0.41–5.90)

## 2017-03-16 LAB — HEMOGLOBIN A1C: HEMOGLOBIN A1C: 5.7

## 2017-03-17 ENCOUNTER — Encounter: Payer: Self-pay | Admitting: Family Medicine

## 2017-03-20 ENCOUNTER — Encounter: Payer: Self-pay | Admitting: Family Medicine

## 2017-03-20 ENCOUNTER — Telehealth: Payer: Self-pay | Admitting: Family Medicine

## 2017-03-20 DIAGNOSIS — E7849 Other hyperlipidemia: Secondary | ICD-10-CM

## 2017-03-20 NOTE — Telephone Encounter (Signed)
I have reviewed pt's labs done via [facility] 03/16/17. Will order referral to lipid clinic. Has he been taking his fenofibrate and niacin? Pt due for CPE at his earliest convenience.

## 2017-03-22 NOTE — Telephone Encounter (Signed)
Left message for pt to call back.   Okay for PEC to advise pt. 

## 2017-03-25 ENCOUNTER — Telehealth: Payer: Self-pay | Admitting: Family Medicine

## 2017-03-25 NOTE — Telephone Encounter (Signed)
Left message for pt to call back.   Okay PEC to advise pt.

## 2017-03-25 NOTE — Telephone Encounter (Signed)
Pt advised and voiced understanding. He stated that he will call back to schedule a CPE, wants to see the cardiologist first. He stated that he has been taking the fenofibrate and niacin off and on.

## 2017-03-25 NOTE — Telephone Encounter (Signed)
Agent let me know pt was calling in for lab results.   There were not any result notes listed under the labs tab to release to the pt.   I was not sure what to advise the pt so had the agent put him in contact with the office.   (They had just called him and left message for him to call back).

## 2017-03-25 NOTE — Telephone Encounter (Signed)
Noted. Referral to cardiology has been ordered on 03/20/17. -thx

## 2017-03-25 NOTE — Telephone Encounter (Signed)
Patient called, phone call was disconnected

## 2017-04-22 ENCOUNTER — Ambulatory Visit: Payer: Commercial Managed Care - PPO | Admitting: Cardiovascular Disease

## 2017-04-23 ENCOUNTER — Ambulatory Visit: Payer: Commercial Managed Care - PPO | Admitting: Cardiovascular Disease

## 2018-06-25 IMAGING — CT CT RENAL STONE PROTOCOL
2 of 4 series · 16 of 46 positions shown, 18 images · non-contrast
Comparison: August 02, 2013

CLINICAL DATA: Right flank pain with difficulty urinating starting
today.

EXAM:
CT ABDOMEN AND PELVIS WITHOUT CONTRAST
TECHNIQUE: Multidetector CT imaging of the abdomen and pelvis was performed
following the standard protocol without IV contrast.

[Series 2: axial st · axial · 0.89mm/px · z∈[+795,+1290]mm · 13 of 109 slices shown, 15 images]
[im 5/109  soft-tissue]
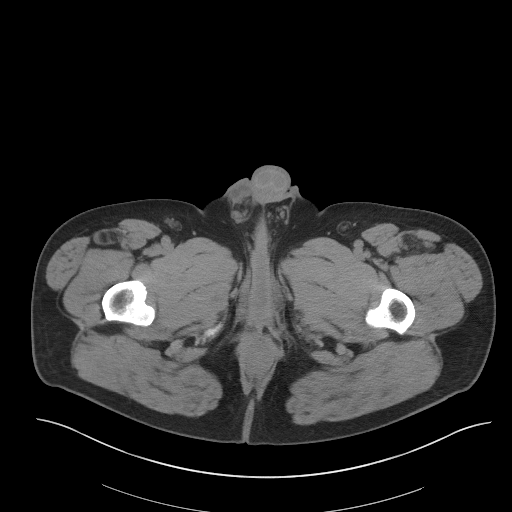
[im 5/109  bone]
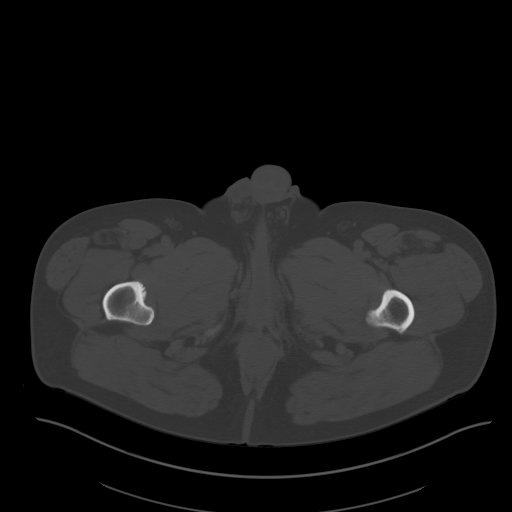
[im 14/109  soft-tissue]
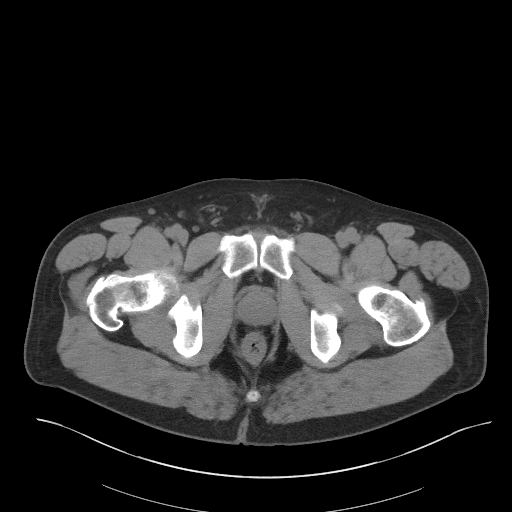
[im 23/109  soft-tissue]
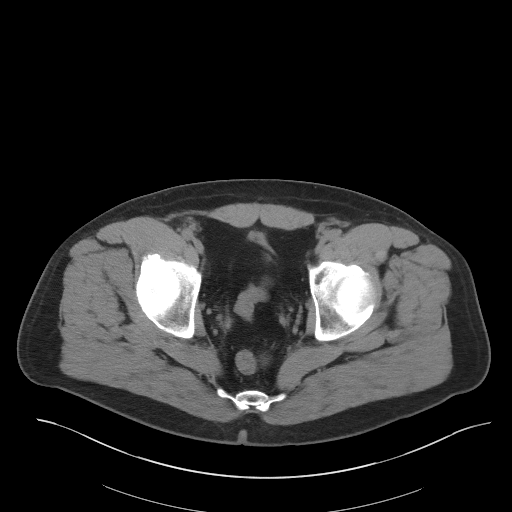
[im 32/109  soft-tissue]
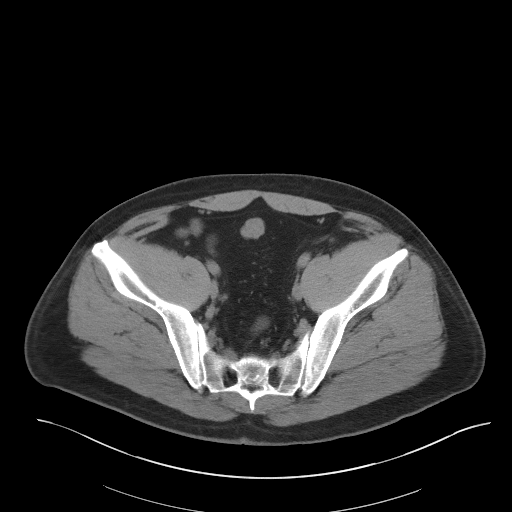
[im 37/109  soft-tissue]
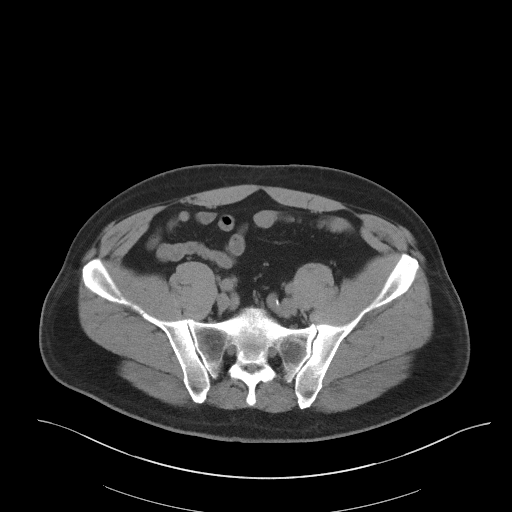
[im 46/109  soft-tissue]
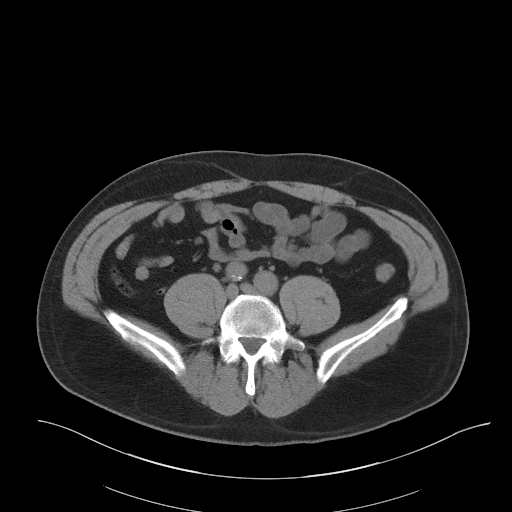
[im 55/109  soft-tissue]
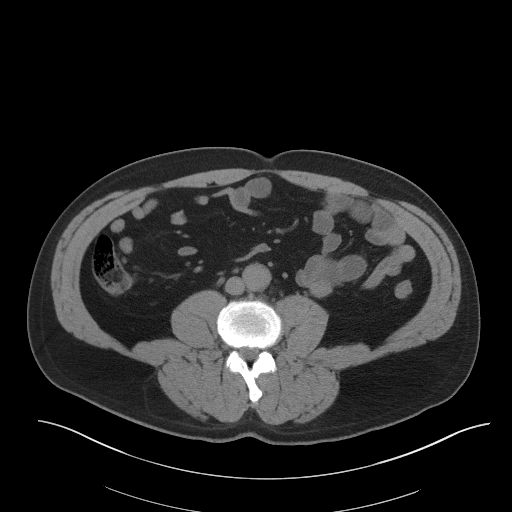
[im 64/109  soft-tissue]
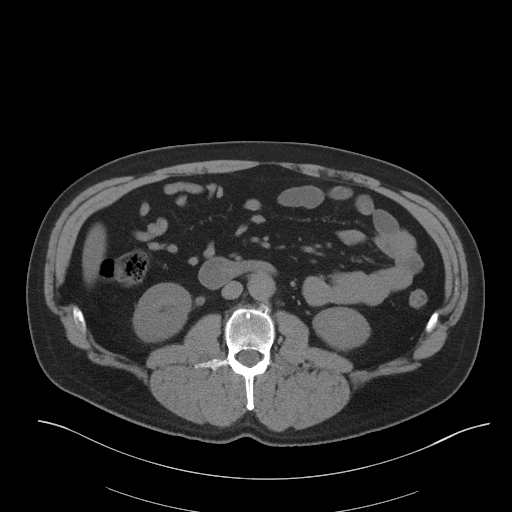
[im 73/109  soft-tissue]
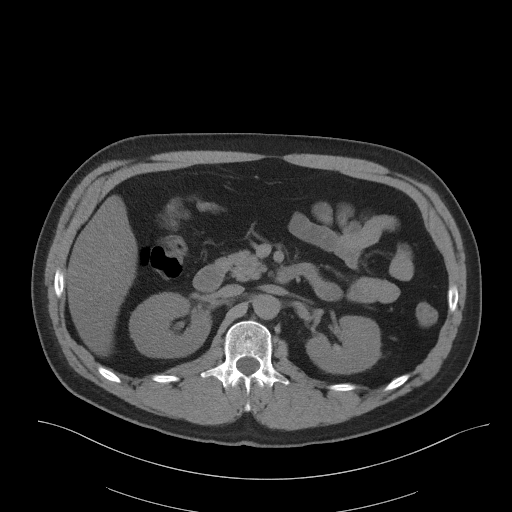
[im 73/109  bone]
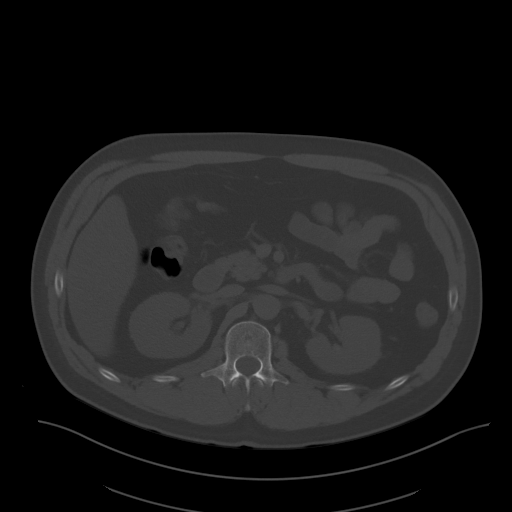
[im 77/109  soft-tissue]
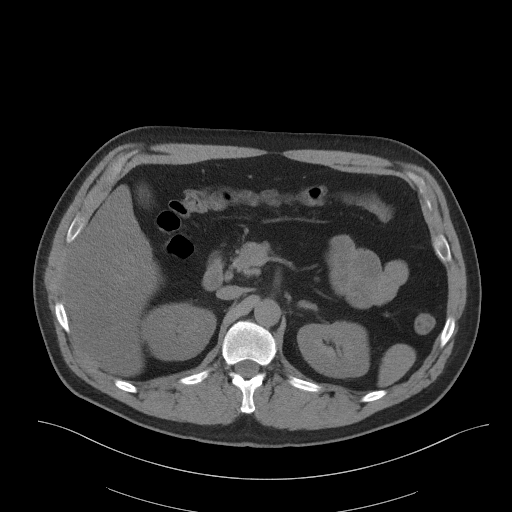
[im 86/109  soft-tissue]
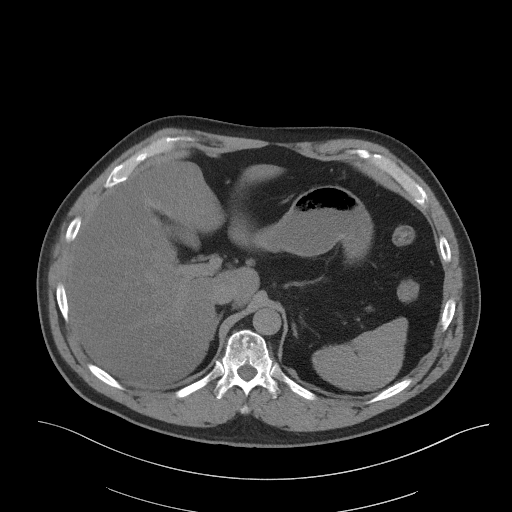
[im 95/109  soft-tissue]
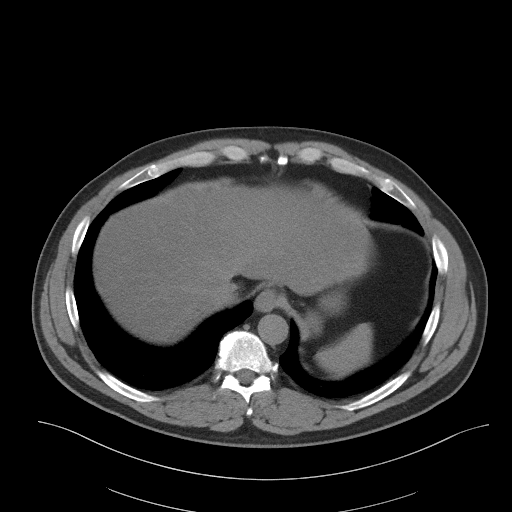
[im 104/109  soft-tissue]
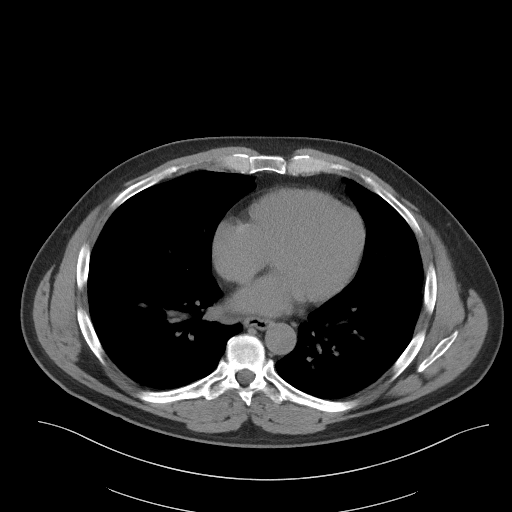

[Series 5: coronal st · coronal · 0.91mm/px · 3 of 101 slices shown]
[im 34/101  soft-tissue]
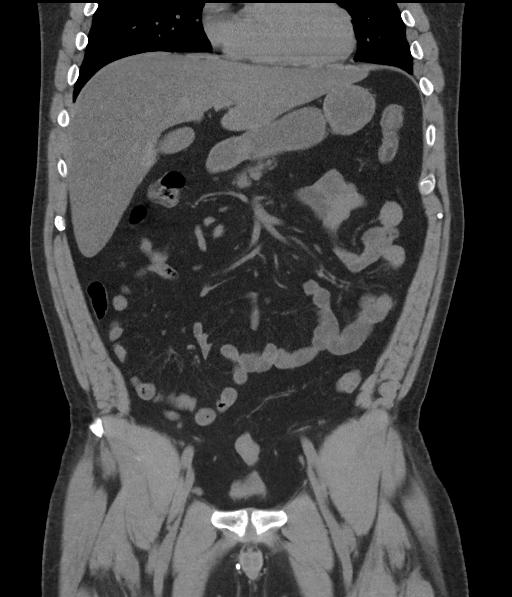
[im 45/101  soft-tissue]
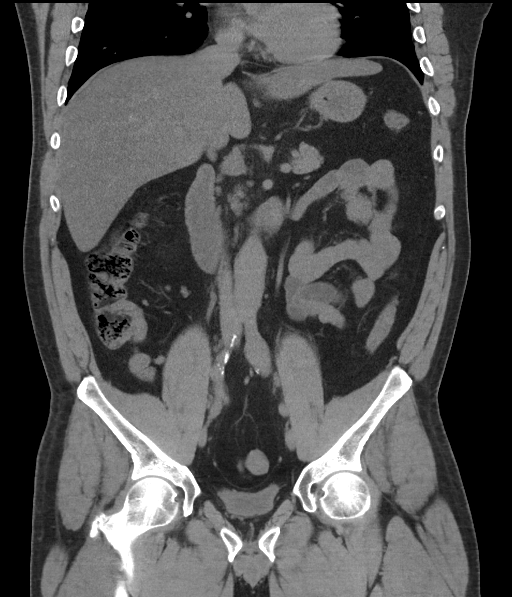
[im 56/101  soft-tissue]
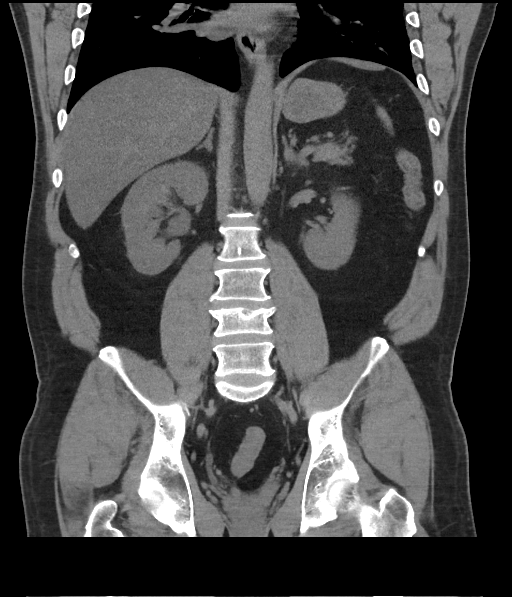

[16 of 46 positions shown; findings below may reference images not displayed]

FINDINGS: Lower chest: There is mild atelectasis of the posterior lung bases.
The heart size is normal.

Hepatobiliary: There is diffuse low density of the liver without
vessel displacement. No focal liver lesion is identified. The
gallbladder and biliary tree are normal.

Pancreas: Unremarkable. No pancreatic ductal dilatation or
surrounding inflammatory changes.

Spleen: Normal in size without focal abnormality.

Adrenals/Urinary Tract: There is right hydroureteronephrosis due to
obstruction by 2 mm stone in the right ureteral vesicular junction.
Mild right perinephric stranding is identified. The left kidney is
normal. There is no left hydronephrosis. The adrenal glands are
normal. Evaluation of bladder is limited due to decompression.

Stomach/Bowel: Stomach is within normal limits. Appendix appears
normal. No evidence of bowel wall thickening, distention, or
inflammatory changes.

Vascular/Lymphatic: Aortic atherosclerosis. No enlarged abdominal or
pelvic lymph nodes.

Reproductive: Prostate is unremarkable.

Other: None.

Musculoskeletal: Degenerative joint changes of the spine are noted.
IMPRESSION: Right hydroureteronephrosis due to obstruction by 2 mm stone in the
right ureteral vesicular junction.

## 2018-10-25 ENCOUNTER — Other Ambulatory Visit: Payer: Self-pay

## 2018-10-25 DIAGNOSIS — Z20822 Contact with and (suspected) exposure to covid-19: Secondary | ICD-10-CM

## 2018-10-28 LAB — NOVEL CORONAVIRUS, NAA: SARS-CoV-2, NAA: NOT DETECTED
